# Patient Record
Sex: Female | Born: 2000 | Race: White | Hispanic: No | Marital: Single | Smoking: Never smoker
Health system: Southern US, Community
[De-identification: ages and names within clinical notes are randomized; demographics above are authoritative.]

## PROBLEM LIST (undated history)

## (undated) DIAGNOSIS — R51 Headache: Secondary | ICD-10-CM

## (undated) HISTORY — DX: Headache: R51

## (undated) HISTORY — PX: MOUTH SURGERY: SHX715

---
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
## 2019-06-16 ENCOUNTER — Other Ambulatory Visit: Payer: Self-pay

## 2019-06-16 ENCOUNTER — Emergency Department (HOSPITAL_COMMUNITY): Payer: BC Managed Care – PPO

## 2019-06-16 ENCOUNTER — Emergency Department (HOSPITAL_COMMUNITY)
Admission: EM | Admit: 2019-06-16 | Discharge: 2019-06-16 | Disposition: A | Discharge status: Home / Self Care | Payer: BC Managed Care – PPO | Attending: Emergency Medicine | Admitting: Emergency Medicine

## 2019-06-16 ENCOUNTER — Encounter (HOSPITAL_COMMUNITY): Payer: Self-pay

## 2019-06-16 DIAGNOSIS — L0501 Pilonidal cyst with abscess: Secondary | ICD-10-CM | POA: Diagnosis not present

## 2019-06-16 DIAGNOSIS — M79672 Pain in left foot: Secondary | ICD-10-CM

## 2019-06-16 DIAGNOSIS — M722 Plantar fascial fibromatosis: Secondary | ICD-10-CM

## 2019-06-16 MED ORDER — IBUPROFEN 600 MG PO TABS: 600.0000 mg | ORAL_TABLET | Freq: Four times a day (QID) | ORAL | 0 refills | Status: AC | PRN

## 2019-06-16 NOTE — ED Triage Notes (Signed)
Patient c/o 7/10 L foot pain after getting [place] a [place] at [place] 3 days ago. Patient states the pain has continuously gotten worse and will bring her to tears. Patient unable to put weight on foot.

## 2019-06-16 NOTE — Discharge Instructions (Signed)
Take the ibuprofen as prescribed to help with the pain and discomfort.  Use the crutches for comfort.  Follow-up with your primary care doctor or consider seeing a sports medicine or orthopedic doctor for further evaluation if the symptoms persist

## 2019-06-16 NOTE — ED Provider Notes (Signed)
[facility]ENC[facility]PT Provider Note   CSN: [id number] Arrival date & time: 06/16/19  1839     History Foot pain  Anna [name] is a 19 y.o. female.  HPI   Patient presents ED for evaluation of left foot pain.  Patient states she went to [employer] 3 days ago.  She was walking around a lot.  Since then she has been experiencing sharp pain on the bottom of her foot that moves towards the top of her foot.  She denies any recent falls.  No twisting injury.  Past Medical History:  Diagnosis Date  . JJOACZYS(063.0)     Patient Active Problem List   Diagnosis Date Noted  . Nausea alone 09/18/2013  . Migraine without aura and without status migrainosus, not intractable 07/11/2013  . Dizzy spells 07/11/2013  . Vasovagal near syncope 07/11/2013  . Orthostatic hypotension 04/24/2012  . Dehydration 04/24/2012  . Viral enteritis 04/24/2012    Past Surgical History:  Procedure Laterality Date  . MOUTH SURGERY       OB History   No obstetric history on file.     Family History  Problem Relation Age of Onset  . Hypertension Father   . ADD / ADHD Brother   . Migraines Mother   . Depression Mother   . ADD / ADHD Cousin   . Depression Other   . Diabetes Maternal Grandmother   . Depression Maternal Grandmother   . Diabetes Paternal Grandmother   . Migraines Paternal Grandmother   . Diabetes Paternal Grandfather   . Migraines Maternal Aunt   . Depression Maternal Aunt   . Anxiety disorder Maternal Aunt     Social History   Tobacco Use  . Smoking status: Never Smoker  . Smokeless tobacco: Never Used  Substance Use Topics  . Alcohol use: No  . Drug use: No    Home Medications Prior to Admission medications   Medication Sig Start Date End Date Taking? Authorizing Provider  ibuprofen (ADVIL) 600 MG tablet Take 1 tablet (600 mg total) by mouth every 6 (six) hours as needed. 06/16/19   Linwood Dibbles, MD  Magnesium Oxide 500 MG TABS Take by mouth.     [provider]  NORTREL 7/7/7 0.5/0.75/1-35 MG-MCG tablet Take 1 tablet by mouth daily.  07/07/13   [provider]  Petasin (PETADOLEX PO) Take by mouth.    [provider]  topiramate (TOPAMAX) 25 MG tablet Take 1 tablet (25 mg total) by mouth 2 (two) times daily. 07/11/13   Keturah Shavers, MD    Allergies    Other and Uncaria tomentosa (cats claw)  Review of Systems   Review of Systems  All other systems reviewed and are negative.   Physical Exam Updated Vital Signs BP 132/90 (BP Location: Left Arm)   Pulse 89   Temp 98.2 F (36.8 C) (Oral)   Resp 16   Ht 1.626 m (5\' 4" )   Wt 102.1 kg   LMP 05/19/2019   SpO2 99%   BMI 38.62 kg/m   Physical Exam Vitals and nursing note reviewed.  Constitutional:      General: She is not in acute distress.    Appearance: She is well-developed.  HENT:     Head: Normocephalic and atraumatic.     Right Ear: External ear normal.     Left Ear: External ear normal.  Eyes:     General: No scleral icterus.       Right eye: No  discharge.        Left eye: No discharge.     Conjunctiva/sclera: Conjunctivae normal.  Neck:     Trachea: No tracheal deviation.  Cardiovascular:     Rate and Rhythm: Normal rate.  Pulmonary:     Effort: Pulmonary effort is normal. No respiratory distress.     Breath sounds: No stridor.  Abdominal:     General: There is no distension.  Musculoskeletal:        General: Tenderness present. No swelling or deformity.     Cervical back: Neck supple.     Comments: Tenderness palpation plantar aspect of left foot, no erythema or edema of the foot, normal pulses, sensation is intact, no cyanosis  Skin:    General: Skin is warm and dry.     Findings: No rash.  Neurological:     Mental Status: She is alert.     Cranial Nerves: Cranial nerve deficit: no gross deficits.     ED Results / Procedures / Treatments   Labs (all labs ordered are listed, but only abnormal results are  displayed) Labs Reviewed - No data to display  EKG None  Radiology DG Foot Complete Left  Result Date: 06/16/2019 CLINICAL DATA:  Left foot pain, 5th metatarsal pain EXAM: LEFT FOOT - COMPLETE 3+ VIEW COMPARISON:  None. FINDINGS: There is no evidence of fracture or dislocation. There is no evidence of arthropathy or other focal bone abnormality. Soft tissues are unremarkable. IMPRESSION: Negative. Electronically Signed   By: Rolm Baptise M.D.   On: 06/16/2019 19:28    Procedures Procedures (including critical care time)  Medications Ordered in ED Medications - No data to display  ED Course  I have reviewed the triage vital signs and the nursing notes.  Pertinent labs & imaging results that were available during my care of the patient were reviewed by me and considered in my medical decision making (see chart for details).  Clinical Course as of Jun 15 1956  Sun Jun 16, 2019  1956 X-ray findings reviewed.  No acute abnormalities.   [JK]    Clinical Course User Index [JK] Dorie Rank, MD   MDM Rules/Calculators/A&P                      Patient presented for foot pain.  No known trauma.  Patient was walking a lot more than usual.  She does have tenderness in the plantar aspect of her foot.  This infection could have plantar fasciitis associated with her recent activity.  Plan on discharge home with prescription for ibuprofen.  Also give her crutches for comfort to help keep off of her foot.  Outpatient follow-up with her primary doctor or consider seeing orthopedics or sports medicine Final Clinical Impression(s) / ED Diagnoses Final diagnoses:  Left foot pain  Plantar fasciitis    Rx / DC Orders ED Discharge Orders         Ordered    ibuprofen (ADVIL) 600 MG tablet  Every 6 hours PRN     06/16/19 1955           Dorie Rank, MD 06/16/19 1958

## 2021-12-02 IMAGING — CR DG FOOT COMPLETE 3+V*L*
3 series · 3 of 3 positions shown · non-contrast
Comparison: None.

CLINICAL DATA: Left foot pain, 5th metatarsal pain

EXAM:
LEFT FOOT - COMPLETE 3+ VIEW

[x foot ap left]
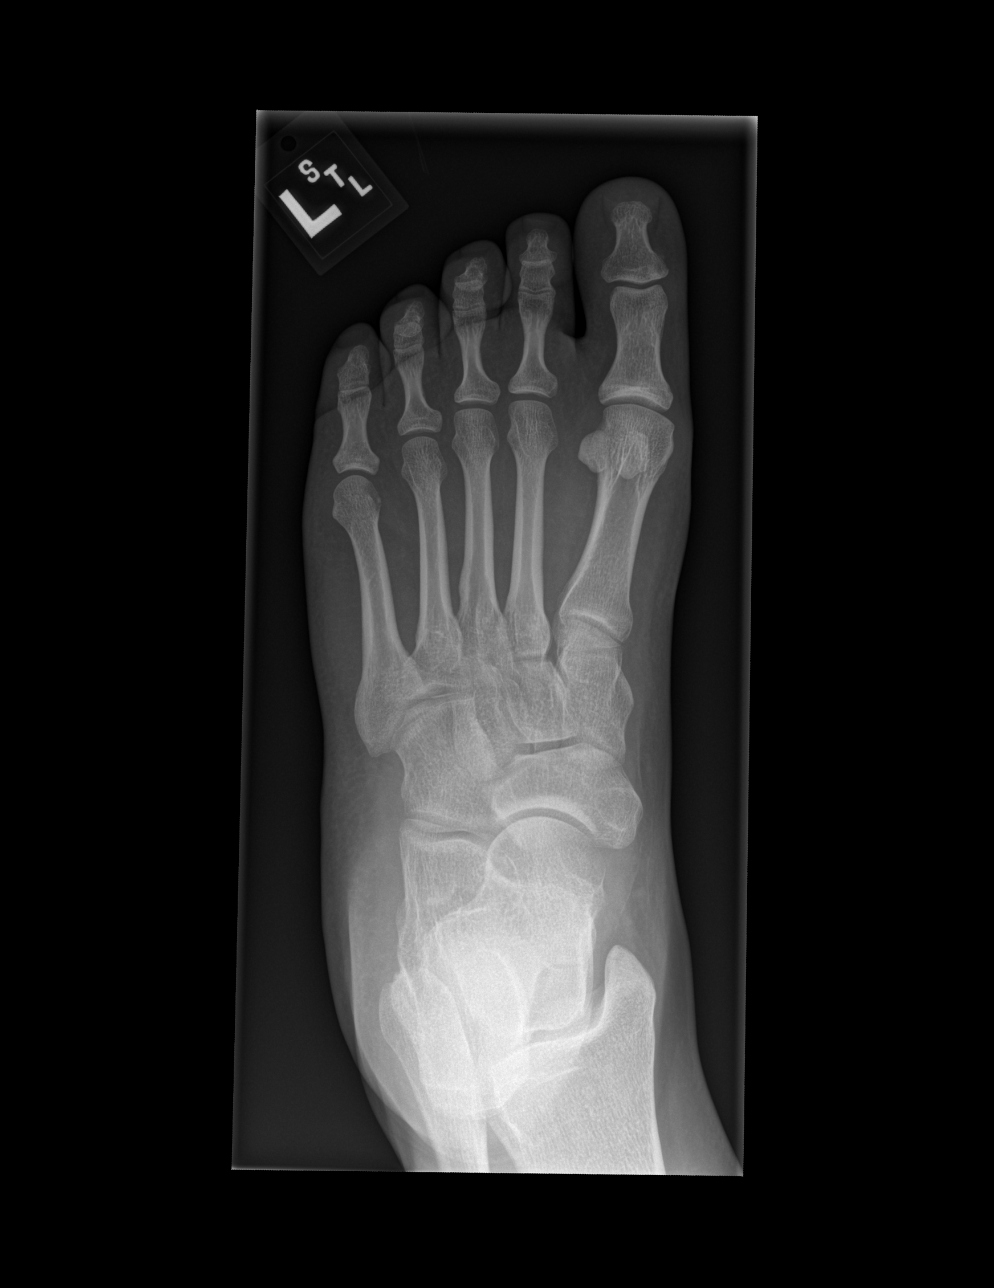

[x foot obl left]
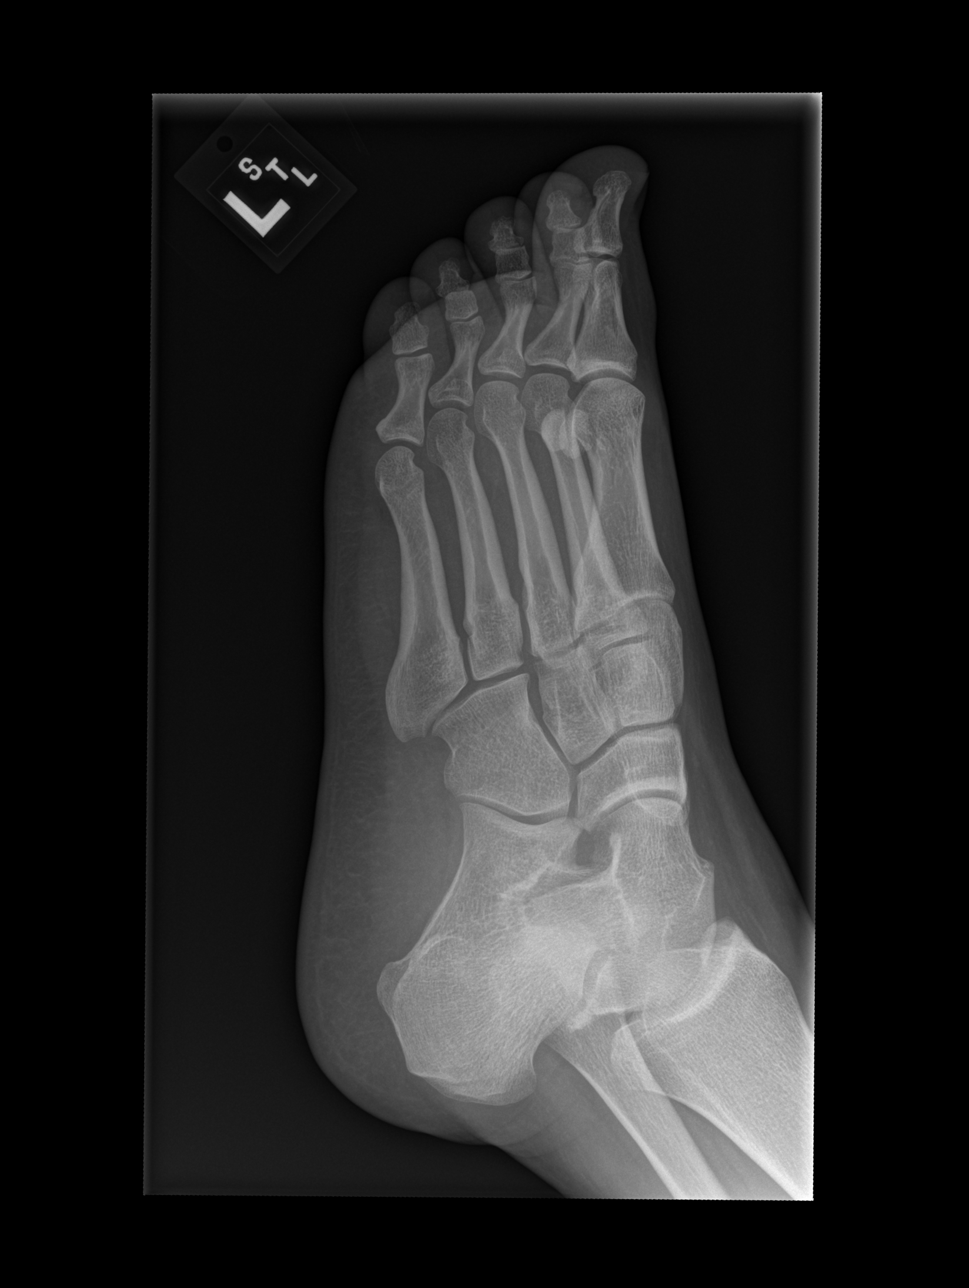

[x foot lat left]
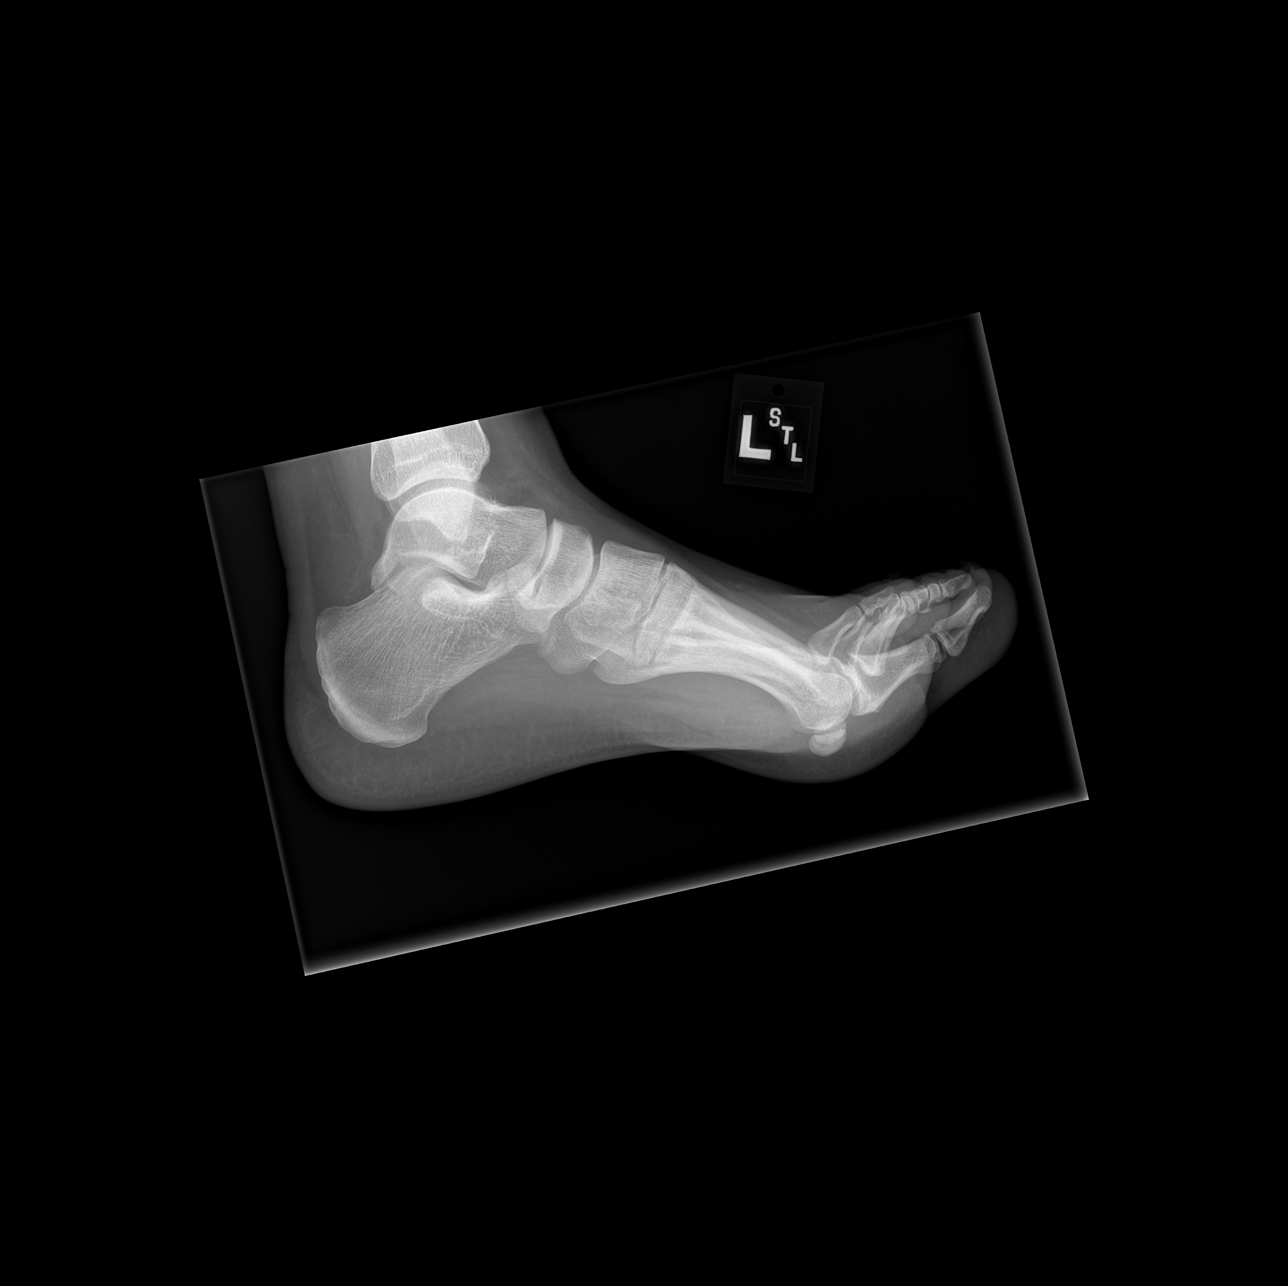

[3 of 3 positions shown; findings below may reference images not displayed]

FINDINGS: There is no evidence of fracture or dislocation. There is no
evidence of arthropathy or other focal bone abnormality. Soft
tissues are unremarkable.
IMPRESSION: Negative.
